# Patient Record
Sex: Female | Born: 1990 | Race: White | Hispanic: No | Marital: Single | Smoking: Former smoker
Health system: Southern US, Community
[De-identification: ages and names within clinical notes are randomized; demographics above are authoritative.]

## PROBLEM LIST (undated history)

## (undated) DIAGNOSIS — F1510 Other stimulant abuse, uncomplicated: Secondary | ICD-10-CM

## (undated) DIAGNOSIS — Z789 Other specified health status: Secondary | ICD-10-CM

## (undated) DIAGNOSIS — B977 Papillomavirus as the cause of diseases classified elsewhere: Secondary | ICD-10-CM

## (undated) DIAGNOSIS — B009 Herpesviral infection, unspecified: Secondary | ICD-10-CM

## (undated) HISTORY — DX: Papillomavirus as the cause of diseases classified elsewhere: B97.7

## (undated) HISTORY — DX: Other specified health status: Z78.9

## (undated) HISTORY — DX: Herpesviral infection, unspecified: B00.9

## (undated) HISTORY — DX: Other stimulant abuse, uncomplicated: F15.10

## (undated) HISTORY — PX: WISDOM TOOTH EXTRACTION: SHX21

---

## 2015-02-19 ENCOUNTER — Encounter (HOSPITAL_COMMUNITY): Payer: Self-pay | Admitting: Emergency Medicine

## 2015-02-19 ENCOUNTER — Emergency Department (HOSPITAL_COMMUNITY)
Admission: EM | Admit: 2015-02-19 | Discharge: 2015-02-19 | Disposition: A | Discharge status: Home / Self Care | Payer: Medicaid Other | Attending: Emergency Medicine | Admitting: Emergency Medicine

## 2015-02-19 DIAGNOSIS — N760 Acute vaginitis: Secondary | ICD-10-CM | POA: Insufficient documentation

## 2015-02-19 DIAGNOSIS — Z87891 Personal history of nicotine dependence: Secondary | ICD-10-CM | POA: Diagnosis not present

## 2015-02-19 DIAGNOSIS — B9689 Other specified bacterial agents as the cause of diseases classified elsewhere: Secondary | ICD-10-CM

## 2015-02-19 DIAGNOSIS — N898 Other specified noninflammatory disorders of vagina: Secondary | ICD-10-CM | POA: Diagnosis present

## 2015-02-19 LAB — WET PREP, GENITAL
Sperm: NONE SEEN
TRICH WET PREP: NONE SEEN
Yeast Wet Prep HPF POC: NONE SEEN

## 2015-02-19 MED ORDER — FLUCONAZOLE 150 MG PO TABS: 150.0000 mg | ORAL_TABLET | Freq: Once | ORAL | Status: DC

## 2015-02-19 MED ORDER — METRONIDAZOLE 500 MG PO TABS: 500.0000 mg | ORAL_TABLET | Freq: Two times a day (BID) | ORAL | Status: DC

## 2015-02-19 NOTE — ED Provider Notes (Signed)
CSN: [id number]     Arrival date & time 02/19/15  1014 History   First MD Initiated Contact with Patient 02/19/15 1056     Chief Complaint  Patient presents with  . Vaginal Discharge     (Consider location/radiation/quality/duration/timing/severity/associated sxs/prior Treatment) HPI Comments: Patient presents to the emergency department with chief complaint of vaginal discharge times several months. She reports new sexual contacts. She states that her vagina feels irritated and has mild discharge. She denies any fevers, chills, nausea, vomiting, diarrhea, constipation, dysuria, or vaginal bleeding. She has not tried taking anything to alleviate her symptoms. There are no aggravating or alleviating factors.  The history is provided by the patient. No language interpreter was used.    History reviewed. No pertinent past medical history. History reviewed. No pertinent past surgical history. History reviewed. No pertinent family history. Social History  Substance Use Topics  . Smoking status: Former Games developermoker  . Smokeless tobacco: None  . Alcohol Use: No   OB History    No data available     Review of Systems  Constitutional: Negative for fever and chills.  Respiratory: Negative for shortness of breath.   Cardiovascular: Negative for chest pain.  Gastrointestinal: Negative for nausea, vomiting, diarrhea and constipation.  Genitourinary: Positive for vaginal discharge. Negative for dysuria.  All other systems reviewed and are negative.     Allergies  Review of patient's allergies indicates no known allergies.  Home Medications   Prior to Admission medications   Not on File   BP 122/78 mmHg  Pulse 70  Temp(Src) 99.1 F (37.3 C) (Oral)  Resp 18  SpO2 98% Physical Exam  Constitutional: She is oriented to person, place, and time. She appears well-developed and well-nourished.  HENT:  Head: Normocephalic and atraumatic.  Eyes: Conjunctivae and EOM are normal. Pupils are  equal, round, and reactive to light.  Neck: Normal range of motion. Neck supple.  Cardiovascular: Normal rate and regular rhythm.  Exam reveals no gallop and no friction rub.   No murmur heard. Pulmonary/Chest: Effort normal and breath sounds normal. No respiratory distress. She has no wheezes. She has no rales. She exhibits no tenderness.  Abdominal: Soft. Bowel sounds are normal. She exhibits no distension and no mass. There is no tenderness. There is no rebound and no guarding.  Genitourinary:  Pelvic exam chaperoned by female ER tech, no right or left adnexal tenderness, no uterine tenderness, moderate white vaginal discharge, no bleeding, no CMT or friability, no foreign body, no injury to the external genitalia, no other significant findings   Musculoskeletal: Normal range of motion. She exhibits no edema or tenderness.  Neurological: She is alert and oriented to person, place, and time.  Skin: Skin is warm and dry.  Psychiatric: She has a normal mood and affect. Her behavior is normal. Judgment and thought content normal.  Nursing note and vitals reviewed.   ED Course  Procedures (including critical care time) Results for orders placed or performed during the hospital encounter of 02/19/15  Wet prep, genital  Result Value Ref Range   Yeast Wet Prep HPF POC NONE SEEN NONE SEEN   Trich, Wet Prep NONE SEEN NONE SEEN   Clue Cells Wet Prep HPF POC PRESENT (A) NONE SEEN   WBC, Wet Prep HPF POC MANY (A) NONE SEEN   Sperm NONE SEEN    No results found.    MDM   Final diagnoses:  Bacterial vaginosis    Patient with vaginal discharge times several months.  She also reports some vaginal itching. Will check wet prep and gonorrhea/chlamydia screen.  Wet prep remarkable for clue cells. Will treat with Flagyl. Will also prescribe Diflucan as patient has history of yeast infections. Instructed her take this after finishing the antibiotic.    Roxy Horseman, PA-C 02/19/15  [address] PA-C 02/19/15 1331  Arby Barrette, MD 02/20/15 [phone]

## 2015-02-19 NOTE — ED Notes (Signed)
Pt sts green vaginal discharge x several months with itching; pt sts living in inpatient rehab facility

## 2015-02-19 NOTE — Discharge Instructions (Signed)

## 2015-02-20 LAB — GC/CHLAMYDIA PROBE AMP (~~LOC~~) NOT AT ARMC
Chlamydia: NEGATIVE
NEISSERIA GONORRHEA: NEGATIVE

## 2019-10-19 LAB — PRENATAL
HSV1: NEGATIVE
HSV2: POSITIVE
VZV:: NEGATIVE

## 2019-10-19 LAB — HSV TYPE 2: HSV2: POSITIVE

## 2020-04-06 NOTE — L&D Delivery Note (Addendum)
LABOR COURSE Valerie Salas is a 30 y.o. [id number] at [redacted]w[redacted]d admitted for active labor.  Delivery Note Called to room and patient was complete and pushing. Head delivered spontaneously. No nuchal cord present. Shoulder and body delivered in usual fashion. At 1203 a viable and healthy female was delivered via Vaginal, Spontaneous (Presentation: vertex; LOA).  Infant with spontaneous cry, placed on mother's abdomen, dried and stimulated. Cord clamped x 2 after 1-minute delay, and cut by aunt. Cord blood drawn. Placenta delivered spontaneously with gentle cord traction. Appears intact. Fundus firm with massage and Pitocin. Labia, perineum, vagina, and cervix inspected with hemostatic 1st degree perineal tear not requiring repair.    APGAR: 8, 9; weight pending Cord: 3VC with the following complications: none.   Cord pH: n/a  Anesthesia: Epidural Episiotomy: None Lacerations: perineal skin tear and 1st degree perineal tear not requiring repair, hemostatic Suture Repair:  n/a Est. Blood Loss (mL): 200  Mom to postpartum.  Baby to Couplet care / Skin to Skin.  Reeves Forth, MD 02/05/21 12:16 PM    Midwife attestation: I was gloved and present for delivery in its entirety and I agree with the above resident's note.  Donette Larry, CNM 1:13 PM

## 2020-09-11 LAB — OB RESULTS CONSOLE HIV ANTIBODY (ROUTINE TESTING): HIV: NONREACTIVE

## 2020-10-02 LAB — HEPATITIS B SURFACE ANTIGEN: Hep B Surface Antigen Quant: NONREACTIVE

## 2020-12-30 LAB — OB RESULTS CONSOLE ABO/RH: RH Type: POSITIVE

## 2020-12-30 LAB — GLUCOSE TOLERANCE, 1 HOUR: Glucose, GTT 1HR Child: 118

## 2020-12-30 LAB — OB RESULTS CONSOLE HGB/HCT, BLOOD
HCT: 33 (ref 29–41)
Hemoglobin: 10.7

## 2020-12-30 LAB — CYTOLOGY - PAP: Pap: NEGATIVE

## 2020-12-30 LAB — OB RESULTS CONSOLE RPR: RPR: NONREACTIVE

## 2020-12-30 LAB — OB RESULTS CONSOLE ANTIBODY SCREEN: Antibody Screen: NEGATIVE

## 2020-12-30 LAB — OB RESULTS CONSOLE PLATELET COUNT: Platelets: 205

## 2020-12-31 ENCOUNTER — Other Ambulatory Visit: Payer: Self-pay | Admitting: Family

## 2020-12-31 DIAGNOSIS — Z36 Encounter for antenatal screening for chromosomal anomalies: Secondary | ICD-10-CM

## 2021-01-02 ENCOUNTER — Other Ambulatory Visit: Payer: Self-pay

## 2021-01-02 ENCOUNTER — Encounter: Payer: Self-pay | Admitting: *Deleted

## 2021-01-02 ENCOUNTER — Ambulatory Visit: Payer: Medicaid Other | Admitting: *Deleted

## 2021-01-02 ENCOUNTER — Ambulatory Visit: Payer: Medicaid Other | Attending: Family

## 2021-01-02 VITALS — BP 116/59 | HR 94 | Ht 64.0 in

## 2021-01-02 DIAGNOSIS — F1510 Other stimulant abuse, uncomplicated: Secondary | ICD-10-CM | POA: Diagnosis present

## 2021-01-02 DIAGNOSIS — Z36 Encounter for antenatal screening for chromosomal anomalies: Secondary | ICD-10-CM

## 2021-01-06 ENCOUNTER — Telehealth: Payer: Self-pay

## 2021-01-06 NOTE — Telephone Encounter (Signed)
Left message for patient to call [facility] @ [phone] for appointments.

## 2021-01-07 ENCOUNTER — Other Ambulatory Visit: Payer: Self-pay | Admitting: *Deleted

## 2021-01-07 DIAGNOSIS — O99320 Drug use complicating pregnancy, unspecified trimester: Secondary | ICD-10-CM

## 2021-01-09 ENCOUNTER — Ambulatory Visit: Payer: Medicaid Other

## 2021-01-10 ENCOUNTER — Ambulatory Visit: Payer: Medicaid Other | Admitting: *Deleted

## 2021-01-10 ENCOUNTER — Encounter: Payer: Self-pay | Admitting: *Deleted

## 2021-01-10 ENCOUNTER — Ambulatory Visit: Payer: Medicaid Other | Attending: Obstetrics | Admitting: *Deleted

## 2021-01-10 ENCOUNTER — Ambulatory Visit: Payer: Medicaid Other

## 2021-01-10 ENCOUNTER — Other Ambulatory Visit: Payer: Medicaid Other

## 2021-01-10 ENCOUNTER — Other Ambulatory Visit: Payer: Self-pay

## 2021-01-10 VITALS — BP 126/62 | HR 71

## 2021-01-10 DIAGNOSIS — O99323 Drug use complicating pregnancy, third trimester: Secondary | ICD-10-CM | POA: Insufficient documentation

## 2021-01-10 DIAGNOSIS — Z3A35 35 weeks gestation of pregnancy: Secondary | ICD-10-CM | POA: Insufficient documentation

## 2021-01-10 NOTE — Procedures (Signed)
Valerie Salas 09-03-1990 [redacted]w[redacted]d  Fetus A Non-Stress Test Interpretation for 01/10/21  Indication:  Meth use  Fetal Heart Rate A Mode: External Baseline Rate (A): 130 bpm Variability: Moderate Accelerations: 15 x 15 Decelerations: None Multiple birth?: No  Uterine Activity Mode: Palpation, Toco Contraction Frequency (min): Occas w/UI Contraction Quality: Mild Resting Tone Palpated: Relaxed Resting Time: Adequate  Interpretation (Fetal Testing) Nonstress Test Interpretation: Reactive Comments: Dr. Parke Poisson reviewed tracing.

## 2021-01-15 ENCOUNTER — Ambulatory Visit (INDEPENDENT_AMBULATORY_CARE_PROVIDER_SITE_OTHER): Payer: Medicaid Other | Admitting: Family Medicine

## 2021-01-15 ENCOUNTER — Encounter: Payer: Self-pay | Admitting: Family Medicine

## 2021-01-15 ENCOUNTER — Other Ambulatory Visit: Payer: Self-pay

## 2021-01-15 ENCOUNTER — Other Ambulatory Visit (HOSPITAL_COMMUNITY)
Admission: RE | Admit: 2021-01-15 | Discharge: 2021-01-15 | Disposition: A | Discharge status: Home / Self Care | Payer: Medicaid Other | Source: Ambulatory Visit | Attending: Family Medicine | Admitting: Family Medicine

## 2021-01-15 VITALS — BP 117/61 | HR 78 | Ht 64.0 in | Wt 133.4 lb

## 2021-01-15 DIAGNOSIS — Z87898 Personal history of other specified conditions: Secondary | ICD-10-CM | POA: Diagnosis not present

## 2021-01-15 DIAGNOSIS — O0990 Supervision of high risk pregnancy, unspecified, unspecified trimester: Secondary | ICD-10-CM | POA: Insufficient documentation

## 2021-01-15 MED ORDER — BLOOD PRESSURE KIT DEVI: 1.0000 | 0 refills | Status: AC | PRN

## 2021-01-15 MED ORDER — GOJJI WEIGHT SCALE MISC: 1.0000 | 0 refills | Status: AC | PRN

## 2021-01-15 NOTE — Patient Instructions (Signed)
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
## 2021-01-16 LAB — CERVICOVAGINAL ANCILLARY ONLY
Chlamydia: NEGATIVE
Comment: NEGATIVE
Comment: NEGATIVE
Comment: NORMAL
Neisseria Gonorrhea: NEGATIVE
Trichomonas: NEGATIVE

## 2021-01-17 ENCOUNTER — Ambulatory Visit: Payer: Medicaid Other | Admitting: *Deleted

## 2021-01-17 ENCOUNTER — Other Ambulatory Visit: Payer: Self-pay

## 2021-01-17 ENCOUNTER — Ambulatory Visit: Payer: Medicaid Other | Attending: Obstetrics

## 2021-01-17 VITALS — BP 119/73 | HR 87

## 2021-01-17 DIAGNOSIS — O99320 Drug use complicating pregnancy, unspecified trimester: Secondary | ICD-10-CM | POA: Insufficient documentation

## 2021-01-17 DIAGNOSIS — O99323 Drug use complicating pregnancy, third trimester: Secondary | ICD-10-CM

## 2021-01-17 DIAGNOSIS — Z3A33 33 weeks gestation of pregnancy: Secondary | ICD-10-CM | POA: Diagnosis not present

## 2021-01-17 DIAGNOSIS — O0990 Supervision of high risk pregnancy, unspecified, unspecified trimester: Secondary | ICD-10-CM

## 2021-01-17 DIAGNOSIS — O0933 Supervision of pregnancy with insufficient antenatal care, third trimester: Secondary | ICD-10-CM | POA: Diagnosis not present

## 2021-01-17 DIAGNOSIS — O09893 Supervision of other high risk pregnancies, third trimester: Secondary | ICD-10-CM

## 2021-01-19 LAB — CULTURE, BETA STREP (GROUP B ONLY): Strep Gp B Culture: NEGATIVE

## 2021-01-20 ENCOUNTER — Encounter: Payer: Self-pay | Admitting: *Deleted

## 2021-01-20 ENCOUNTER — Other Ambulatory Visit: Payer: Self-pay

## 2021-01-21 ENCOUNTER — Encounter: Payer: Medicaid Other | Admitting: Family Medicine

## 2021-01-23 ENCOUNTER — Ambulatory Visit: Payer: Medicaid Other

## 2021-01-23 ENCOUNTER — Ambulatory Visit (HOSPITAL_BASED_OUTPATIENT_CLINIC_OR_DEPARTMENT_OTHER): Payer: Medicaid Other

## 2021-01-23 ENCOUNTER — Other Ambulatory Visit: Payer: Self-pay

## 2021-01-23 ENCOUNTER — Ambulatory Visit: Payer: Medicaid Other | Attending: Obstetrics | Admitting: *Deleted

## 2021-01-23 VITALS — BP 123/68 | HR 78

## 2021-01-23 DIAGNOSIS — O99320 Drug use complicating pregnancy, unspecified trimester: Secondary | ICD-10-CM

## 2021-01-23 DIAGNOSIS — Z362 Encounter for other antenatal screening follow-up: Secondary | ICD-10-CM

## 2021-01-23 DIAGNOSIS — O99323 Drug use complicating pregnancy, third trimester: Secondary | ICD-10-CM | POA: Insufficient documentation

## 2021-01-23 DIAGNOSIS — O0933 Supervision of pregnancy with insufficient antenatal care, third trimester: Secondary | ICD-10-CM | POA: Diagnosis not present

## 2021-01-23 DIAGNOSIS — F1591 Other stimulant use, unspecified, in remission: Secondary | ICD-10-CM | POA: Insufficient documentation

## 2021-01-23 DIAGNOSIS — Z3A37 37 weeks gestation of pregnancy: Secondary | ICD-10-CM | POA: Insufficient documentation

## 2021-01-23 DIAGNOSIS — O09893 Supervision of other high risk pregnancies, third trimester: Secondary | ICD-10-CM

## 2021-01-23 DIAGNOSIS — O0990 Supervision of high risk pregnancy, unspecified, unspecified trimester: Secondary | ICD-10-CM

## 2021-01-23 LAB — TOXASSURE SELECT 13 (MW), URINE

## 2021-01-28 ENCOUNTER — Other Ambulatory Visit: Payer: Self-pay

## 2021-01-28 ENCOUNTER — Inpatient Hospital Stay (HOSPITAL_COMMUNITY)
Admission: AD | Admit: 2021-01-28 | Discharge: 2021-01-28 | Disposition: A | Discharge status: Home / Self Care | Payer: Medicaid Other | Attending: Obstetrics & Gynecology | Admitting: Obstetrics & Gynecology

## 2021-01-28 ENCOUNTER — Encounter (HOSPITAL_COMMUNITY): Payer: Self-pay | Admitting: Obstetrics & Gynecology

## 2021-01-28 ENCOUNTER — Telehealth: Payer: Self-pay | Admitting: Family Medicine

## 2021-01-28 ENCOUNTER — Ambulatory Visit (INDEPENDENT_AMBULATORY_CARE_PROVIDER_SITE_OTHER): Payer: Medicaid Other

## 2021-01-28 VITALS — BP 116/82 | HR 83 | Wt 134.2 lb

## 2021-01-28 DIAGNOSIS — O0990 Supervision of high risk pregnancy, unspecified, unspecified trimester: Secondary | ICD-10-CM

## 2021-01-28 DIAGNOSIS — Z3A38 38 weeks gestation of pregnancy: Secondary | ICD-10-CM

## 2021-01-28 DIAGNOSIS — Z0371 Encounter for suspected problem with amniotic cavity and membrane ruled out: Secondary | ICD-10-CM | POA: Diagnosis present

## 2021-01-28 DIAGNOSIS — O471 False labor at or after 37 completed weeks of gestation: Secondary | ICD-10-CM | POA: Insufficient documentation

## 2021-01-28 DIAGNOSIS — Z3689 Encounter for other specified antenatal screening: Secondary | ICD-10-CM | POA: Diagnosis not present

## 2021-01-28 LAB — POCT FERN TEST: POCT Fern Test: NEGATIVE

## 2021-01-28 LAB — AMNISURE RUPTURE OF MEMBRANE (ROM) NOT AT ARMC: Amnisure ROM: NEGATIVE

## 2021-01-28 NOTE — Telephone Encounter (Signed)
Patient want to know when her induction will be

## 2021-01-28 NOTE — MAU Provider Note (Signed)
Event Date/Time   First Provider Initiated Contact with Patient 01/28/21 1240       S: Ms. PARUL PORCELLI is a 30 y.o. [id number] at [redacted]w[redacted]d  who presents to MAU today complaining of leaking of fluid since Saturday. She denies vaginal bleeding. She endorses occasional contractions, but considers them to be BH. She reports normal fetal movement.    O: BP 125/61 (BP Location: Right Arm)   Pulse 90   Temp 98 F (36.7 C)   Resp 15   Wt 61.7 kg   LMP  (LMP Unknown)   SpO2 98%   BMI 23.34 kg/m  GENERAL: Well-developed, well-nourished female in no acute distress.  HEAD: Normocephalic, atraumatic.  CHEST: Normal effort of breathing, regular heart rate ABDOMEN: Soft, nontender, gravid PELVIC: Normal external female genitalia. Vagina is pink and rugated. Cervix with normal contour, no lesions. Normal discharge.  Negative pooling. No fluid noted with valsalva maneuver. Fern Collected  Cervical exam:   Deferred   Fetal Monitoring: FHT: 150 bpm, Mod Var, -Decels, +Accels Toco: Irritability Noted  Results for orders placed or performed during the hospital encounter of 01/28/21 (from the past 24 hour(s))  Fern Test     Status: Normal   Collection Time: 01/28/21 12:13 PM  Result Value Ref Range   POCT Fern Test Negative = intact amniotic membranes   Amnisure rupture of membrane (rom)not at [facility]     Status: None   Collection Time: 01/28/21 12:19 PM  Result Value Ref Range   Amnisure ROM NEGATIVE      A: SIUP at [redacted]w[redacted]d  R/O Prolonged SROM Cat I FT  P: Fern Negative Nurse to collect Akins and send  Gerrit Heck, CNM 01/28/2021 12:40 PM  Reassessment (1:17 PM)  -Amnisure returns negative. -NST Reactive. -Patient informed of results. -Precautions given. -Encouraged to call primary office or return to MAU if symptoms worsen or with the onset of new symptoms. -Discharged to home in stable condition.  Cherre Robins MSN, CNM Advanced Practice Provider, [facility] for [employer]

## 2021-01-28 NOTE — Progress Notes (Signed)
Here today for routine OB appt. Provider is sick, visit changed to nurse visit. Patient checked in by [facility], CMA. Vitals and FHR normal. Pt reports irregular contractions, increased pelvic pressure, and leaking fluid over the last few days. Pt endorses good fetal movement. Recommended patient go to MAU for further evaluation due to no provider availability in office. Report given to [place], [place].  Fleet Contras RN 01/28/21

## 2021-01-28 NOTE — MAU Note (Signed)
.  Valerie Salas is a 30 y.o. at [redacted]w[redacted]d here in MAU reporting: sent from office because she had a gush of clear fluid on Sunday x1 while she was walking. Does not think it was her water breaking. Denies VB. Endorses good fetal movement. States she is having some ctx but they are irregular.   Pain score: 4 Vitals:   01/28/21 1130  BP: 125/61  Pulse: 90  Resp: 15  Temp: 98 F (36.7 C)  SpO2: 98%     FHT: 162

## 2021-01-29 ENCOUNTER — Encounter: Payer: Self-pay | Admitting: *Deleted

## 2021-01-29 ENCOUNTER — Telehealth: Payer: Self-pay | Admitting: *Deleted

## 2021-01-29 DIAGNOSIS — O0990 Supervision of high risk pregnancy, unspecified, unspecified trimester: Secondary | ICD-10-CM

## 2021-01-29 DIAGNOSIS — Z87898 Personal history of other specified conditions: Secondary | ICD-10-CM | POA: Insufficient documentation

## 2021-01-29 NOTE — Telephone Encounter (Signed)
Fadumo called front desk and asked to speak with a nurse. She is wanting to know when she will have IOL scheduled. I informed her that our protocol is to schedule at 39 weeks for 41 weeks; so we will schedule next week at her ob visit when she is 39 weeks. We discussed she could go into labor before then.  She voices understanding.  Ranelle Auker,RN

## 2021-01-29 NOTE — Progress Notes (Signed)
Patient was evaluated by nursing staff. Agree with assessment and plan.  °

## 2021-01-29 NOTE — Telephone Encounter (Signed)
I called Mandie and was told by another female she is not available right now but they will give her a message. I asked her to tell Hanna we were calling her back and she can call us again. Derik Fults,RN

## 2021-02-04 ENCOUNTER — Other Ambulatory Visit: Payer: Self-pay | Admitting: Advanced Practice Midwife

## 2021-02-04 ENCOUNTER — Ambulatory Visit (INDEPENDENT_AMBULATORY_CARE_PROVIDER_SITE_OTHER): Payer: Medicaid Other | Admitting: Family Medicine

## 2021-02-04 ENCOUNTER — Other Ambulatory Visit: Payer: Self-pay

## 2021-02-04 ENCOUNTER — Encounter: Payer: Self-pay | Admitting: Family Medicine

## 2021-02-04 VITALS — BP 115/78 | HR 100 | Wt 136.7 lb

## 2021-02-04 DIAGNOSIS — Z87898 Personal history of other specified conditions: Secondary | ICD-10-CM

## 2021-02-04 DIAGNOSIS — O0990 Supervision of high risk pregnancy, unspecified, unspecified trimester: Secondary | ICD-10-CM

## 2021-02-04 MED ORDER — VALACYCLOVIR HCL 500 MG PO TABS: 500.0000 mg | ORAL_TABLET | Freq: Two times a day (BID) | ORAL | 2 refills | Status: DC

## 2021-02-04 NOTE — Progress Notes (Signed)
Patient is currently leaking vaginal fluids that started roughly 10 minutes ago. She stated that she is not having any painful contractions but does feel her "stomach getting tight" along with some pressure but denies any pain. Patient is not sure if her "water" is broken. Provider has been made aware for further assessment.   Dawayne Patricia, CMA   02/04/21   11:14 am

## 2021-02-04 NOTE — Progress Notes (Signed)
Patient stated that she has ran out of her Valtrex Rx and last pil was this past Saturday or Sunday    [place], CMA

## 2021-02-04 NOTE — Patient Instructions (Signed)
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
## 2021-02-05 ENCOUNTER — Inpatient Hospital Stay (HOSPITAL_COMMUNITY): Payer: Medicaid Other | Admitting: Anesthesiology

## 2021-02-05 ENCOUNTER — Inpatient Hospital Stay (HOSPITAL_COMMUNITY)
Admission: AD | Admit: 2021-02-05 | Discharge: 2021-02-06 | DRG: 806 | Disposition: A | Discharge status: Home / Self Care | Payer: Medicaid Other | Attending: Obstetrics and Gynecology | Admitting: Obstetrics and Gynecology

## 2021-02-05 ENCOUNTER — Encounter: Payer: Self-pay | Admitting: Family Medicine

## 2021-02-05 ENCOUNTER — Encounter (HOSPITAL_COMMUNITY): Payer: Self-pay | Admitting: Family Medicine

## 2021-02-05 DIAGNOSIS — Z3A39 39 weeks gestation of pregnancy: Secondary | ICD-10-CM

## 2021-02-05 DIAGNOSIS — O99323 Drug use complicating pregnancy, third trimester: Secondary | ICD-10-CM | POA: Diagnosis not present

## 2021-02-05 DIAGNOSIS — F1510 Other stimulant abuse, uncomplicated: Secondary | ICD-10-CM | POA: Diagnosis present

## 2021-02-05 DIAGNOSIS — O479 False labor, unspecified: Secondary | ICD-10-CM

## 2021-02-05 DIAGNOSIS — O99324 Drug use complicating childbirth: Secondary | ICD-10-CM | POA: Diagnosis present

## 2021-02-05 DIAGNOSIS — Z87891 Personal history of nicotine dependence: Secondary | ICD-10-CM

## 2021-02-05 DIAGNOSIS — O480 Post-term pregnancy: Secondary | ICD-10-CM | POA: Diagnosis present

## 2021-02-05 DIAGNOSIS — Z20822 Contact with and (suspected) exposure to covid-19: Secondary | ICD-10-CM | POA: Diagnosis present

## 2021-02-05 DIAGNOSIS — O0990 Supervision of high risk pregnancy, unspecified, unspecified trimester: Secondary | ICD-10-CM

## 2021-02-05 DIAGNOSIS — Z2839 Other underimmunization status: Secondary | ICD-10-CM | POA: Insufficient documentation

## 2021-02-05 DIAGNOSIS — O09899 Supervision of other high risk pregnancies, unspecified trimester: Secondary | ICD-10-CM | POA: Insufficient documentation

## 2021-02-05 DIAGNOSIS — Z87898 Personal history of other specified conditions: Secondary | ICD-10-CM

## 2021-02-05 LAB — CBC
HCT: 36.1 % (ref 36.0–46.0)
Hemoglobin: 12.1 g/dL (ref 12.0–15.0)
MCH: 31.3 pg (ref 26.0–34.0)
MCHC: 33.5 g/dL (ref 30.0–36.0)
MCV: 93.3 fL (ref 80.0–100.0)
Platelets: 217 10*3/uL (ref 150–400)
RBC: 3.87 MIL/uL (ref 3.87–5.11)
RDW: 14.4 % (ref 11.5–15.5)
WBC: 9 10*3/uL (ref 4.0–10.5)
nRBC: 0 % (ref 0.0–0.2)

## 2021-02-05 LAB — TYPE AND SCREEN
ABO/RH(D): O POS
Antibody Screen: NEGATIVE

## 2021-02-05 LAB — HEPATITIS B SURFACE ANTIGEN: Hepatitis B Surface Ag: NEGATIVE

## 2021-02-05 LAB — HIV ANTIBODY (ROUTINE TESTING W REFLEX): HIV Screen 4th Generation wRfx: NONREACTIVE

## 2021-02-05 LAB — RESP PANEL BY RT-PCR (FLU A&B, COVID) ARPGX2
Influenza A by PCR: NEGATIVE
Influenza B by PCR: NEGATIVE
SARS Coronavirus 2 by RT PCR: NEGATIVE

## 2021-02-05 LAB — HEPATITIS C ANTIBODY: Hep C Virus Ab: <0.1 s/co ratio (ref 0.0–0.9)

## 2021-02-05 LAB — RPR: RPR Ser Ql: NONREACTIVE

## 2021-02-05 LAB — SYPHILIS: RPR W/REFLEX TO RPR TITER AND TREPONEMAL ANTIBODIES, TRADITIONAL SCREENING AND DIAGNOSIS ALGORITHM: RPR Ser Ql: NONREACTIVE

## 2021-02-05 LAB — RUBELLA ANTIBODY, IGM: Rubella IgM: <20 AU/mL (ref 0.0–19.9)

## 2021-02-05 MED ORDER — SIMETHICONE 80 MG PO CHEW: 80.0000 mg | CHEWABLE_TABLET | ORAL | Status: DC | PRN

## 2021-02-05 MED ORDER — FENTANYL CITRATE (PF) 100 MCG/2ML IJ SOLN
50.0000 ug | INTRAMUSCULAR | Status: DC | PRN
  Administered 2021-02-05: 50 ug via INTRAVENOUS
  Filled 2021-02-05: qty 2

## 2021-02-05 MED ORDER — PHENYLEPHRINE 40 MCG/ML (10ML) SYRINGE FOR IV PUSH (FOR BLOOD PRESSURE SUPPORT)
80.0000 ug | PREFILLED_SYRINGE | INTRAVENOUS | Status: DC | PRN
  Filled 2021-02-05: qty 10

## 2021-02-05 MED ORDER — SENNOSIDES-DOCUSATE SODIUM 8.6-50 MG PO TABS
2.0000 | ORAL_TABLET | ORAL | Status: DC
  Administered 2021-02-05 – 2021-02-06 (×2): 2 via ORAL
  Filled 2021-02-05 (×2): qty 2

## 2021-02-05 MED ORDER — DIBUCAINE (PERIANAL) 1 % EX OINT: 1.0000 "application " | TOPICAL_OINTMENT | CUTANEOUS | Status: DC | PRN

## 2021-02-05 MED ORDER — OXYTOCIN-SODIUM CHLORIDE 30-0.9 UT/500ML-% IV SOLN: 2.5000 [IU]/h | INTRAVENOUS | Status: DC

## 2021-02-05 MED ORDER — OXYTOCIN-SODIUM CHLORIDE 30-0.9 UT/500ML-% IV SOLN
1.0000 m[IU]/min | INTRAVENOUS | Status: DC
  Administered 2021-02-05: 2 m[IU]/min via INTRAVENOUS
  Filled 2021-02-05: qty 500

## 2021-02-05 MED ORDER — DIPHENHYDRAMINE HCL 50 MG/ML IJ SOLN
12.5000 mg | INTRAMUSCULAR | Status: DC | PRN
  Administered 2021-02-05: 12.5 mg via INTRAVENOUS
  Filled 2021-02-05: qty 1

## 2021-02-05 MED ORDER — OXYTOCIN-SODIUM CHLORIDE 30-0.9 UT/500ML-% IV SOLN: 1.0000 m[IU]/min | INTRAVENOUS | Status: DC

## 2021-02-05 MED ORDER — SOD CITRATE-CITRIC ACID 500-334 MG/5ML PO SOLN: 30.0000 mL | ORAL | Status: DC | PRN

## 2021-02-05 MED ORDER — ONDANSETRON HCL 4 MG PO TABS: 4.0000 mg | ORAL_TABLET | ORAL | Status: DC | PRN

## 2021-02-05 MED ORDER — MEDROXYPROGESTERONE ACETATE 150 MG/ML IM SUSP
150.0000 mg | Freq: Once | INTRAMUSCULAR | Status: AC
  Administered 2021-02-06: 150 mg via INTRAMUSCULAR
  Filled 2021-02-05: qty 1

## 2021-02-05 MED ORDER — ZOLPIDEM TARTRATE 5 MG PO TABS: 5.0000 mg | ORAL_TABLET | Freq: Every evening | ORAL | Status: DC | PRN

## 2021-02-05 MED ORDER — FENTANYL-BUPIVACAINE-NACL 0.5-0.125-0.9 MG/250ML-% EP SOLN
12.0000 mL/h | EPIDURAL | Status: DC | PRN
  Administered 2021-02-05: 12 mL/h via EPIDURAL
  Filled 2021-02-05: qty 250

## 2021-02-05 MED ORDER — COCONUT OIL OIL: 1.0000 "application " | TOPICAL_OIL | Status: DC | PRN

## 2021-02-05 MED ORDER — LACTATED RINGERS IV SOLN: 500.0000 mL | INTRAVENOUS | Status: DC | PRN

## 2021-02-05 MED ORDER — PRENATAL MULTIVITAMIN CH
1.0000 | ORAL_TABLET | Freq: Every day | ORAL | Status: DC
  Administered 2021-02-06: 1 via ORAL
  Filled 2021-02-05: qty 1

## 2021-02-05 MED ORDER — TERBUTALINE SULFATE 1 MG/ML IJ SOLN: 0.2500 mg | Freq: Once | INTRAMUSCULAR | Status: DC | PRN

## 2021-02-05 MED ORDER — OXYTOCIN BOLUS FROM INFUSION
333.0000 mL | Freq: Once | INTRAVENOUS | Status: AC
  Administered 2021-02-05: 333 mL via INTRAVENOUS

## 2021-02-05 MED ORDER — LACTATED RINGERS IV SOLN: INTRAVENOUS | Status: DC

## 2021-02-05 MED ORDER — EPHEDRINE 5 MG/ML INJ: 10.0000 mg | INTRAVENOUS | Status: DC | PRN

## 2021-02-05 MED ORDER — LIDOCAINE HCL (PF) 1 % IJ SOLN
INTRAMUSCULAR | Status: DC | PRN
  Administered 2021-02-05 (×2): 4 mL via EPIDURAL

## 2021-02-05 MED ORDER — ONDANSETRON HCL 4 MG/2ML IJ SOLN: 4.0000 mg | INTRAMUSCULAR | Status: DC | PRN

## 2021-02-05 MED ORDER — IBUPROFEN 600 MG PO TABS
600.0000 mg | ORAL_TABLET | Freq: Four times a day (QID) | ORAL | Status: DC
  Administered 2021-02-05 – 2021-02-06 (×4): 600 mg via ORAL
  Filled 2021-02-05 (×4): qty 1

## 2021-02-05 MED ORDER — LIDOCAINE HCL (PF) 1 % IJ SOLN: 30.0000 mL | INTRAMUSCULAR | Status: DC | PRN

## 2021-02-05 MED ORDER — ONDANSETRON HCL 4 MG/2ML IJ SOLN: 4.0000 mg | Freq: Four times a day (QID) | INTRAMUSCULAR | Status: DC | PRN

## 2021-02-05 MED ORDER — BENZOCAINE-MENTHOL 20-0.5 % EX AERO
1.0000 "application " | INHALATION_SPRAY | CUTANEOUS | Status: DC | PRN
  Administered 2021-02-05: 1 via TOPICAL
  Filled 2021-02-05: qty 56

## 2021-02-05 MED ORDER — DIPHENHYDRAMINE HCL 25 MG PO CAPS
25.0000 mg | ORAL_CAPSULE | Freq: Four times a day (QID) | ORAL | Status: DC | PRN
  Administered 2021-02-05: 25 mg via ORAL
  Filled 2021-02-05: qty 1

## 2021-02-05 MED ORDER — LACTATED RINGERS IV SOLN: 500.0000 mL | Freq: Once | INTRAVENOUS | Status: DC

## 2021-02-05 MED ORDER — WITCH HAZEL-GLYCERIN EX PADS: 1.0000 "application " | MEDICATED_PAD | CUTANEOUS | Status: DC | PRN

## 2021-02-05 MED ORDER — ACETAMINOPHEN 325 MG PO TABS
650.0000 mg | ORAL_TABLET | ORAL | Status: DC | PRN
  Administered 2021-02-05 – 2021-02-06 (×3): 650 mg via ORAL
  Filled 2021-02-05 (×3): qty 2

## 2021-02-05 MED ORDER — ACETAMINOPHEN 325 MG PO TABS: 650.0000 mg | ORAL_TABLET | ORAL | Status: DC | PRN

## 2021-02-05 MED ORDER — PHENYLEPHRINE 40 MCG/ML (10ML) SYRINGE FOR IV PUSH (FOR BLOOD PRESSURE SUPPORT)
80.0000 ug | PREFILLED_SYRINGE | INTRAVENOUS | Status: DC | PRN
  Administered 2021-02-05: 80 ug via INTRAVENOUS

## 2021-02-05 MED ORDER — TETANUS-DIPHTH-ACELL PERTUSSIS 5-2.5-18.5 LF-MCG/0.5 IM SUSY: 0.5000 mL | PREFILLED_SYRINGE | Freq: Once | INTRAMUSCULAR | Status: DC

## 2021-02-05 NOTE — Anesthesia Preprocedure Evaluation (Signed)
Anesthesia Evaluation  Patient identified by MRN, date of birth, ID band Patient awake    Reviewed: Allergy & Precautions, Patient's Chart, lab work & pertinent test results  History of Anesthesia Complications Negative for: history of anesthetic complications  Airway Mallampati: II  TM Distance: >3 FB Neck ROM: Full    Dental no notable dental hx.    Pulmonary former smoker,    Pulmonary exam normal        Cardiovascular negative cardio ROS Normal cardiovascular exam     Neuro/Psych negative neurological ROS  negative psych ROS   GI/Hepatic negative GI ROS, (+)     substance abuse  methamphetamine use,   Endo/Other  negative endocrine ROS  Renal/GU negative Renal ROS  negative genitourinary   Musculoskeletal negative musculoskeletal ROS (+)   Abdominal   Peds  Hematology negative hematology ROS (+)   Anesthesia Other Findings Day of surgery medications reviewed with patient.  Reproductive/Obstetrics (+) Pregnancy                             Anesthesia Physical Anesthesia Plan  ASA: 2  Anesthesia Plan: Epidural   Post-op Pain Management:    Induction:   PONV Risk Score and Plan: Treatment may vary due to age or medical condition  Airway Management Planned: Natural Airway  Additional Equipment:   Intra-op Plan:   Post-operative Plan:   Informed Consent: I have reviewed the patients History and Physical, chart, labs and discussed the procedure including the risks, benefits and alternatives for the proposed anesthesia with the patient or authorized representative who has indicated his/her understanding and acceptance.       Plan Discussed with:   Anesthesia Plan Comments:         Anesthesia Quick Evaluation

## 2021-02-05 NOTE — Discharge Summary (Signed)
Postpartum Discharge Summary      Patient Name: Valerie Salas DOB: January 27, 1991 MRN: [id number]  Date of admission: 02/05/2021 Delivery date:02/05/2021  Delivering provider: Julianne Handler  Date of discharge: 02/06/2021  Admitting diagnosis: Post-dates pregnancy [O48.0] Intrauterine pregnancy: [redacted]w[redacted]d    Secondary diagnosis:  Principal Problem:   Vaginal delivery Active Problems:   Post-dates pregnancy  Additional problems: Remote hx of substance abuse    Discharge diagnosis: Term Pregnancy Delivered                                              Post partum procedures: none Augmentation: Pitocin Complications: None  Hospital course: Onset of Labor With Vaginal Delivery      30y.o. yo [id number] admitted in Active Labor on 02/05/2021. Patient had an uncomplicated labor course as follows:  Membrane Rupture Time/Date: 7:14 AM ,02/05/2021   Delivery Method:Vaginal, Spontaneous  Episiotomy: None none Lacerations:  None superficial skin-hemostatic Patient had an uncomplicated postpartum course.  She is ambulating, tolerating a regular diet, passing flatus, and urinating well. Patient is discharged home in stable condition on 02/06/21.  Newborn Data: Birth date:02/05/2021  Birth time:12:03 PM  Gender:Female  Living status:Living  Apgars:8 ,9  Weight:3439 g   Magnesium Sulfate received: No BMZ received: No Rhophylac:N/A MMR:N/A T-DaP: no Flu: N/A Transfusion:No  Physical exam  Vitals:   02/05/21 1517 02/05/21 1900 02/05/21 2315 02/06/21 0315  BP: 134/66 138/75 115/72 120/78  Pulse: 76 74 72 69  Resp: _0 Temp: 97.9 F (36.6 C) 98.2 F (36.8 C) 97.9 F (36.6 C) 97.9 F (36.6 C)  TempSrc: Axillary Oral Oral Oral  SpO2: 100% 100% 100%    General: alert, cooperative, and no distress Lochia: appropriate Uterine Fundus: firm Incision: N/A DVT Evaluation: No evidence of DVT seen on physical exam. Labs: Lab Results  Component Value Date   WBC 9.0  02/05/2021   HGB 12.1 02/05/2021   HCT 36.1 02/05/2021   MCV 93.3 02/05/2021   PLT 217 02/05/2021   No flowsheet data found. Edinburgh Score: Edinburgh Postnatal Depression Scale Screening Tool 02/06/2021  I have been able to laugh and see the funny side of things. 0  I have looked forward with enjoyment to things. 0  I have blamed myself unnecessarily when things went wrong. 1  I have been anxious or worried for no good reason. 0  I have felt scared or panicky for no good reason. 0  Things have been getting on top of me. 0  I have been so unhappy that I have had difficulty sleeping. 0  I have felt sad or miserable. 0  I have been so unhappy that I have been crying. 0  The thought of harming myself has occurred to me. 0  Edinburgh Postnatal Depression Scale Total 1     After visit meds:  Allergies as of 02/06/2021   No Known Allergies      Medication List     STOP taking these medications    valACYclovir 500 MG tablet Commonly known as: VALTREX       TAKE these medications    Blood Pressure Kit Devi 1 Device by Does not apply route as needed.   Gojji Weight Scale Misc 1 Device by Does not apply route as needed.   ibuprofen 600 MG tablet  Commonly known as: ADVIL Take 1 tablet (600 mg total) by mouth every 6 (six) hours.   multivitamin-prenatal 27-0.8 MG Tabs tablet Take 1 tablet by mouth daily at 12 noon.        Discharge home in stable condition Infant Feeding: Bottle Infant Disposition:home with mother Discharge instruction: per After Visit Summary and Postpartum booklet. Activity: Advance as tolerated. Pelvic rest for 6 weeks.  Diet: routine diet Future Appointments: Future Appointments  Date Time Provider [place]  03/19/2021 10:55 AM Gabriel Carina, CNM [facility]   Follow up Visit:  [place] [facility] [place] [facility] Follow up.   Specialty: Obstetrics and  Gynecology Contact information: [place] [id number] [phone]                 Please schedule this patient for a Virtual postpartum visit in 6 weeks with the following provider: Any provider. Additional Postpartum F/U: none   Low risk pregnancy complicated by:  n/a Delivery mode:  Vaginal, Spontaneous  Anticipated Birth Control:  PP Depo given   02/06/2021 Hansel Feinstein, CNM

## 2021-02-05 NOTE — Anesthesia Procedure Notes (Signed)
Epidural Patient location during procedure: OB Start time: 02/05/2021 6:57 AM End time: 02/05/2021 7:00 AM  Staffing Anesthesiologist: Kaylyn Layer, MD Performed: anesthesiologist   Preanesthetic Checklist Completed: patient identified, IV checked, risks and benefits discussed, monitors and equipment checked, pre-op evaluation and timeout performed  Epidural Patient position: sitting Prep: DuraPrep and site prepped and draped Patient monitoring: continuous pulse ox, blood pressure and heart rate Approach: midline Location: L3-L4 Injection technique: LOR air  Needle:  Needle type: Tuohy  Needle gauge: 17 G Needle length: 9 cm Needle insertion depth: 5 cm Catheter type: closed end flexible Catheter size: 19 Gauge Catheter at skin depth: 10 cm Test dose: negative and Other (1% lidocaine)  Assessment Events: blood not aspirated, injection not painful, no injection resistance, no paresthesia and negative IV test  Additional Notes Patient identified. Risks, benefits, and alternatives discussed with patient including but not limited to bleeding, infection, nerve damage, paralysis, failed block, incomplete pain control, headache, blood pressure changes, nausea, vomiting, reactions to medication, itching, and postpartum back pain. Confirmed with bedside nurse the patient's most recent platelet count. Confirmed with patient that they are not currently taking any anticoagulation, have any bleeding history, or any family history of bleeding disorders. Patient expressed understanding and wished to proceed. All questions were answered. Sterile technique was used throughout the entire procedure. Please see nursing notes for vital signs.   Crisp LOR on first pass. Test dose was given through epidural catheter and negative prior to continuing to dose epidural or start infusion. Warning signs of high block given to the patient including shortness of breath, tingling/numbness in hands, complete  motor block, or any concerning symptoms with instructions to call for help. Patient was given instructions on fall risk and not to get out of bed. All questions and concerns addressed with instructions to call with any issues or inadequate analgesia.  Reason for block:procedure for pain
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
## 2021-02-05 NOTE — Progress Notes (Signed)
Labor Progress Note JAKIRA MCFADDEN is a 30 y.o. [id number] at [redacted]w[redacted]d admitted for active labor. S: Feeling comfortable. Reclining in bed. Epidural in place.  O:  BP 118/67   Pulse 70   Temp (!) 97.4 F (36.3 C) (Axillary)   Resp 17   LMP  (LMP Unknown)   SpO2 100%  EFM: Baseline 125/moderate variability/accelerations present, a few variable decelerations present  CVE: Dilation: 8 Effacement (%): 90 Station: -1 Presentation: Vertex Exam by:: Ronnell Freshwater, RN   A&P: 30 y.o. [id number] [redacted]w[redacted]d admitted for active labor. #Labor: Progressing well. Contractions spaced out, so starting pitocin to encourage better pattern. #Pain: Well controlled on epidural #FWB: Category II. Positional changes as needed. Will consider bolus or other interventions as needed. #GBS negative #History of methamphetamine use, in remission -  plan for social support postpartum Anticipate SVD  Reeves Forth, MD 9:53 AM

## 2021-02-06 LAB — RUBELLA SCREEN: Rubella: 1.85 index (ref >0.99)

## 2021-02-06 MED ORDER — OXYCODONE HCL 5 MG PO TABS
5.0000 mg | ORAL_TABLET | ORAL | Status: DC | PRN
  Administered 2021-02-06 (×4): 5 mg via ORAL
  Filled 2021-02-06 (×4): qty 1

## 2021-02-06 MED ORDER — IBUPROFEN 600 MG PO TABS: 600.0000 mg | ORAL_TABLET | Freq: Four times a day (QID) | ORAL | 0 refills | Status: AC

## 2021-02-06 NOTE — Progress Notes (Addendum)
Post Partum Day 1 Subjective: no complaints, up ad lib, voiding, tolerating PO, and + flatus  Objective: Blood pressure 120/78, pulse 69, temperature 97.9 F (36.6 C), temperature source Oral, resp. rate 18, SpO2 100 %, unknown if currently breastfeeding.  Physical Exam:  General: alert, cooperative, and appears stated age Lochia: appropriate Uterine Fundus: firm Incision: n/a DVT Evaluation: No evidence of DVT seen on physical exam. Negative Homan's sign. No cords or calf tenderness. No significant calf/ankle edema.  Recent Labs    02/05/21 0545  HGB 12.1  HCT 36.1    Assessment/Plan: Plan for discharge tomorrow Needs social work to arrange supplies for baby and postpartum care before discharge. #MOF: bottle #MOC: depo-provera shot #Desires circumcision of baby   LOS: 1 day   Reeves Forth 02/06/2021, 7:07 AM
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
## 2021-02-10 ENCOUNTER — Inpatient Hospital Stay (HOSPITAL_COMMUNITY): Payer: Medicaid Other

## 2021-02-10 ENCOUNTER — Inpatient Hospital Stay (HOSPITAL_COMMUNITY): Admission: AD | Admit: 2021-02-10 | Payer: Medicaid Other | Source: Home / Self Care | Admitting: Family Medicine

## 2021-02-17 NOTE — Anesthesia Postprocedure Evaluation (Signed)
Anesthesia Post Note  Patient: Valerie Salas  Procedure(s) Performed: AN AD HOC LABOR EPIDURAL     Patient location during evaluation: Women's Unit Anesthesia Type: Epidural Level of consciousness: awake and alert Pain management: pain level controlled Vital Signs Assessment: post-procedure vital signs reviewed and stable Respiratory status: spontaneous breathing, nonlabored ventilation and respiratory function stable Cardiovascular status: blood pressure returned to baseline and stable Postop Assessment: no apparent nausea or vomiting Anesthetic complications: no   No notable events documented.  Last Vitals:  Vitals:   02/05/21 2315 02/06/21 0315  BP: 115/72 120/78  Pulse: 72 69  Resp: 18 18  Temp: 36.6 C 36.6 C  SpO2: 100%     Last Pain:  Vitals:   02/06/21 2159  TempSrc:   PainSc: 8                  Lowella Curb

## 2021-02-20 ENCOUNTER — Telehealth (HOSPITAL_COMMUNITY): Payer: Self-pay | Admitting: *Deleted

## 2021-02-20 NOTE — Telephone Encounter (Signed)
Phone # not working.  Duffy Rhody, RN 02-20-2021 at 10:56am

## 2021-03-19 ENCOUNTER — Ambulatory Visit: Payer: Medicaid Other | Admitting: Certified Nurse Midwife
